# Patient Record
Sex: Female | Born: 1970 | Race: White | Hispanic: No | Smoking: Never smoker
Health system: Southern US, Community
[De-identification: ages and names within clinical notes are randomized; demographics above are authoritative.]

## PROBLEM LIST (undated history)

## (undated) DIAGNOSIS — F329 Major depressive disorder, single episode, unspecified: Secondary | ICD-10-CM

## (undated) DIAGNOSIS — F32A Depression, unspecified: Secondary | ICD-10-CM

## (undated) HISTORY — PX: ABDOMINAL HYSTERECTOMY: SHX81

---

## 2006-06-20 ENCOUNTER — Emergency Department: Payer: Self-pay | Admitting: Emergency Medicine

## 2007-06-24 ENCOUNTER — Ambulatory Visit: Payer: Self-pay | Admitting: Unknown Physician Specialty

## 2007-06-30 ENCOUNTER — Ambulatory Visit: Payer: Self-pay | Admitting: Unknown Physician Specialty

## 2007-08-19 ENCOUNTER — Ambulatory Visit: Payer: Self-pay | Admitting: Obstetrics and Gynecology

## 2007-08-30 ENCOUNTER — Inpatient Hospital Stay: Payer: Self-pay | Admitting: Obstetrics and Gynecology

## 2010-01-31 ENCOUNTER — Ambulatory Visit: Payer: Self-pay

## 2010-02-12 ENCOUNTER — Ambulatory Visit: Payer: Self-pay

## 2010-02-26 ENCOUNTER — Ambulatory Visit: Payer: Self-pay | Admitting: Surgery

## 2012-05-24 ENCOUNTER — Ambulatory Visit: Payer: Self-pay

## 2014-12-12 ENCOUNTER — Encounter: Payer: Self-pay | Admitting: Emergency Medicine

## 2014-12-12 ENCOUNTER — Emergency Department
Admission: EM | Admit: 2014-12-12 | Discharge: 2014-12-12 | Disposition: A | Discharge status: Home / Self Care | Payer: BC Managed Care – PPO | Attending: Emergency Medicine | Admitting: Emergency Medicine

## 2014-12-12 DIAGNOSIS — Z760 Encounter for issue of repeat prescription: Secondary | ICD-10-CM | POA: Diagnosis present

## 2014-12-12 DIAGNOSIS — Z79899 Other long term (current) drug therapy: Secondary | ICD-10-CM | POA: Diagnosis not present

## 2014-12-12 HISTORY — DX: Depression, unspecified: F32.A

## 2014-12-12 HISTORY — DX: Major depressive disorder, single episode, unspecified: F32.9

## 2014-12-12 MED ORDER — ESTROGENS CONJUGATED 0.45 MG PO TABS: 0.4500 mg | ORAL_TABLET | Freq: Every day | ORAL | Status: DC

## 2014-12-12 MED ORDER — DULOXETINE HCL 20 MG PO CPEP: 20.0000 mg | ORAL_CAPSULE | Freq: Two times a day (BID) | ORAL | Status: DC

## 2014-12-12 NOTE — Discharge Instructions (Signed)
Medication Refill, Emergency Department °We have refilled your medication today as a courtesy to you. It is best for your medical care, however, to take care of getting refills done through your primary caregiver's office. They have your records and can do a better job of follow-up than we can in the emergency department. °On maintenance medications, we often only prescribe enough medications to get you by until you are able to see your regular caregiver. This is a more expensive way to refill medications. °In the future, please plan for refills so that you will not have to use the emergency department for this. °Thank you for your help. Your help allows us to better take care of the daily emergencies that enter our department. °Document Released: 06/27/2003 Document Revised: 06/02/2011 Document Reviewed: 06/17/2013 °ExitCare® Patient Information ©2015 [employer]. This information is not intended to replace advice given to you by your health care provider. Make sure you discuss any questions you have with your health care provider. ° °

## 2014-12-12 NOTE — ED Provider Notes (Signed)
[facility] Emergency Department Provider Note  ____________________________________________  Time seen: On arrival  I have reviewed the triage vital signs and the nursing notes.   HISTORY  Chief Complaint Medication Refill    HPI Alexandra Parks is a 44 y.o. female who presents to the emergency department for medication refill. She reports she is running low on her Cymbalta and Premarin. The CT prescriber her physician at [facility] side but they only do obstetrics now. She is searching for a new provider. She has no symptomsor physical complaints. Denies suicidal ideation but does admit to several stressors at this time.    Past Medical History  Diagnosis Date  . Depression     There are no active problems to display for this patient.   Past Surgical History  Procedure Laterality Date  . Abdominal hysterectomy    . Cesarean section      Current Outpatient Rx  Name  Route  Sig  Dispense  Refill  . DULoxetine (CYMBALTA) 20 MG capsule   Oral   Take 20 mg by mouth 2 (two) times daily.         Marland Kitchen estrogens, conjugated, (PREMARIN) 0.45 MG tablet   Oral   Take 0.45 mg by mouth daily. Take daily for 21 days then do not take for 7 days.           Allergies Sulfa antibiotics  No family history on file.  Social History Social History  Substance Use Topics  . Smoking status: Never Smoker   . Smokeless tobacco: None  . Alcohol Use: No    Review of Systems  Constitutional: Negative for fever. Eyes: Negative for visual changes. ENT: Negative for sore throat Genitourinary: Negative for dysuria. Musculoskeletal: Negative for back pain. Skin: Negative for rash. Neurological: Negative for headaches    ____________________________________________   PHYSICAL EXAM:  VITAL SIGNS: ED Triage Vitals  Enc Vitals Group     BP 12/12/14 1053 157/85 mmHg     Pulse Rate 12/12/14 1052 111     Resp 12/12/14 1052 16     Temp 12/12/14 1052 98.1 F  (36.7 C)     Temp Source 12/12/14 1052 Oral     SpO2 12/12/14 1052 100 %     Weight 12/12/14 1052 185 lb (83.915 kg)     Height 12/12/14 1052  (1.626 m)     Head Cir --      Peak Flow --      Pain Score 12/12/14 1053 0     Pain Loc --      Pain Edu? --      Excl. in GC? --      Constitutional: Alert and oriented. Well appearing and in no distress. Eyes: Conjunctivae are normal.  ENT   Head: Normocephalic and atraumatic.   Mouth/Throat: Mucous membranes are moist. Respiratory: Normal respiratory effort without tachypnea nor retractions.  Gastrointestinal: Soft and non-tender in all quadrants. No distention. There is no CVA tenderness. Musculoskeletal: Nontender with normal range of motion in all extremities. Neurologic:  Normal speech and language. No gross focal neurologic deficits are appreciated. Skin:  Skin is warm, dry and intact. No rash noted. Psychiatric: Mood and affect are normal. Patient exhibits appropriate insight and judgment.  ____________________________________________    LABS (pertinent positives/negatives)  Labs Reviewed - No data to display  ____________________________________________     ____________________________________________    RADIOLOGY I have personally reviewed any xrays that were ordered on this patient: None ____________________________________________  PROCEDURES  Procedure(s) performed: none   ____________________________________________   INITIAL IMPRESSION / ASSESSMENT AND PLAN / ED COURSE  Pertinent labs & imaging results that were available during my care of the patient were reviewed by me and considered in my medical decision making (see chart for details).  Patient well-appearing no acute distress. She feels well. We will refill her Cymbalta  that she has been on them for some time and has tolerated them well and she knows that she needs to find a primary care provider. Patient has no interest in seeing  a psychiatrist today to discuss the stress that is going on her life  ____________________________________________   FINAL CLINICAL IMPRESSION(S) / ED DIAGNOSES  Final diagnoses:  Encounter for medication refill     Jene Every, MD 12/12/14 1132

## 2014-12-12 NOTE — ED Notes (Signed)
Pt reports needing a medication refill for cymbalta. Reports PCP isn't seeing patients now.

## 2014-12-12 NOTE — ED Notes (Signed)
States she is running out of her meds   Was seen in [facility] at [facility] for general medical issues .denies any sx's

## 2016-10-24 ENCOUNTER — Emergency Department
Admission: EM | Admit: 2016-10-24 | Discharge: 2016-10-24 | Disposition: A | Discharge status: Home / Self Care | Payer: No Typology Code available for payment source | Attending: Emergency Medicine | Admitting: Emergency Medicine

## 2016-10-24 ENCOUNTER — Emergency Department: Payer: No Typology Code available for payment source

## 2016-10-24 ENCOUNTER — Encounter: Payer: Self-pay | Admitting: Emergency Medicine

## 2016-10-24 DIAGNOSIS — Y92410 Unspecified street and highway as the place of occurrence of the external cause: Secondary | ICD-10-CM | POA: Diagnosis not present

## 2016-10-24 DIAGNOSIS — M791 Myalgia: Secondary | ICD-10-CM | POA: Insufficient documentation

## 2016-10-24 DIAGNOSIS — R1011 Right upper quadrant pain: Secondary | ICD-10-CM

## 2016-10-24 DIAGNOSIS — S301XXA Contusion of abdominal wall, initial encounter: Secondary | ICD-10-CM | POA: Insufficient documentation

## 2016-10-24 DIAGNOSIS — Y9389 Activity, other specified: Secondary | ICD-10-CM | POA: Insufficient documentation

## 2016-10-24 DIAGNOSIS — N390 Urinary tract infection, site not specified: Secondary | ICD-10-CM | POA: Insufficient documentation

## 2016-10-24 DIAGNOSIS — Y999 Unspecified external cause status: Secondary | ICD-10-CM | POA: Diagnosis not present

## 2016-10-24 DIAGNOSIS — M7918 Myalgia, other site: Secondary | ICD-10-CM

## 2016-10-24 DIAGNOSIS — S3991XA Unspecified injury of abdomen, initial encounter: Secondary | ICD-10-CM | POA: Diagnosis present

## 2016-10-24 DIAGNOSIS — R1032 Left lower quadrant pain: Secondary | ICD-10-CM

## 2016-10-24 DIAGNOSIS — R52 Pain, unspecified: Secondary | ICD-10-CM

## 2016-10-24 LAB — URINALYSIS, COMPLETE (UACMP) WITH MICROSCOPIC
Bilirubin Urine: NEGATIVE
GLUCOSE, UA: NEGATIVE mg/dL
Hgb urine dipstick: NEGATIVE
Ketones, ur: NEGATIVE mg/dL
NITRITE: POSITIVE — AB
PROTEIN: NEGATIVE mg/dL
Specific Gravity, Urine: 1.017 (ref 1.005–1.030)
pH: 5 (ref 5.0–8.0)

## 2016-10-24 MED ORDER — CYCLOBENZAPRINE HCL 10 MG PO TABS: 10.0000 mg | ORAL_TABLET | Freq: Three times a day (TID) | ORAL | 0 refills | Status: DC | PRN

## 2016-10-24 MED ORDER — KETOROLAC TROMETHAMINE 30 MG/ML IJ SOLN
30.0000 mg | Freq: Once | INTRAMUSCULAR | Status: AC
  Administered 2016-10-24: 30 mg via INTRAMUSCULAR
  Filled 2016-10-24: qty 1

## 2016-10-24 MED ORDER — IBUPROFEN 600 MG PO TABS: 600.0000 mg | ORAL_TABLET | Freq: Three times a day (TID) | ORAL | 0 refills | Status: DC | PRN

## 2016-10-24 MED ORDER — ORPHENADRINE CITRATE 30 MG/ML IJ SOLN
60.0000 mg | Freq: Once | INTRAMUSCULAR | Status: AC
  Administered 2016-10-24: 60 mg via INTRAMUSCULAR
  Filled 2016-10-24: qty 2

## 2016-10-24 MED ORDER — CIPROFLOXACIN HCL 500 MG PO TABS: 500.0000 mg | ORAL_TABLET | Freq: Two times a day (BID) | ORAL | 0 refills | Status: DC

## 2016-10-24 MED ORDER — OXYCODONE-ACETAMINOPHEN 5-325 MG PO TABS: 1.0000 | ORAL_TABLET | Freq: Four times a day (QID) | ORAL | 0 refills | Status: DC | PRN

## 2016-10-24 MED ORDER — HYDROMORPHONE HCL 1 MG/ML IJ SOLN
1.0000 mg | Freq: Once | INTRAMUSCULAR | Status: AC
Start: 2016-10-24 — End: 2016-10-24
  Administered 2016-10-24: 1 mg via INTRAMUSCULAR
  Filled 2016-10-24: qty 1

## 2016-10-24 NOTE — ED Notes (Signed)
Patient c/o numbness. PA notified.
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
## 2016-10-24 NOTE — ED Notes (Signed)
Pt verbalized understanding of discharge instructions. NAD at this time. 
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
## 2016-11-02 ENCOUNTER — Emergency Department
Admission: EM | Admit: 2016-11-02 | Discharge: 2016-11-02 | Disposition: A | Discharge status: Home / Self Care | Payer: No Typology Code available for payment source | Attending: Emergency Medicine | Admitting: Emergency Medicine

## 2016-11-02 ENCOUNTER — Emergency Department: Payer: No Typology Code available for payment source

## 2016-11-02 ENCOUNTER — Encounter: Payer: Self-pay | Admitting: Emergency Medicine

## 2016-11-02 DIAGNOSIS — S86819A Strain of other muscle(s) and tendon(s) at lower leg level, unspecified leg, initial encounter: Secondary | ICD-10-CM | POA: Insufficient documentation

## 2016-11-02 DIAGNOSIS — Z79899 Other long term (current) drug therapy: Secondary | ICD-10-CM | POA: Insufficient documentation

## 2016-11-02 DIAGNOSIS — R0789 Other chest pain: Secondary | ICD-10-CM | POA: Diagnosis not present

## 2016-11-02 DIAGNOSIS — Y939 Activity, unspecified: Secondary | ICD-10-CM | POA: Diagnosis not present

## 2016-11-02 DIAGNOSIS — Y999 Unspecified external cause status: Secondary | ICD-10-CM | POA: Insufficient documentation

## 2016-11-02 DIAGNOSIS — Y929 Unspecified place or not applicable: Secondary | ICD-10-CM | POA: Insufficient documentation

## 2016-11-02 DIAGNOSIS — S86912A Strain of unspecified muscle(s) and tendon(s) at lower leg level, left leg, initial encounter: Secondary | ICD-10-CM

## 2016-11-02 DIAGNOSIS — S8992XA Unspecified injury of left lower leg, initial encounter: Secondary | ICD-10-CM | POA: Diagnosis present

## 2016-11-02 DIAGNOSIS — R0781 Pleurodynia: Secondary | ICD-10-CM

## 2016-11-02 MED ORDER — MELOXICAM 15 MG PO TABS: 15.0000 mg | ORAL_TABLET | Freq: Every day | ORAL | 0 refills | Status: DC

## 2016-11-02 MED ORDER — CYCLOBENZAPRINE HCL 10 MG PO TABS: 10.0000 mg | ORAL_TABLET | Freq: Three times a day (TID) | ORAL | 0 refills | Status: DC | PRN
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
## 2016-11-02 NOTE — Discharge Instructions (Signed)
Please follow up with orthopedics.  Stop the ibuprofen and take the meloxicam instead. Wear the knee brace when you are out of bed. Return to the emergency department for symptoms of that change or worsen if her unable schedule an appointment.
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
## 2016-11-02 NOTE — ED Notes (Signed)
See triage note. conts to have pain  States pain is mainly in left knee  Min swelling

## 2016-11-05 ENCOUNTER — Encounter: Payer: Self-pay | Admitting: *Deleted

## 2016-11-05 ENCOUNTER — Emergency Department: Payer: No Typology Code available for payment source

## 2016-11-05 ENCOUNTER — Emergency Department
Admission: EM | Admit: 2016-11-05 | Discharge: 2016-11-05 | Disposition: A | Discharge status: Home / Self Care | Payer: No Typology Code available for payment source | Attending: Emergency Medicine | Admitting: Emergency Medicine

## 2016-11-05 DIAGNOSIS — R1084 Generalized abdominal pain: Secondary | ICD-10-CM | POA: Diagnosis present

## 2016-11-05 DIAGNOSIS — Z79899 Other long term (current) drug therapy: Secondary | ICD-10-CM | POA: Diagnosis not present

## 2016-11-05 DIAGNOSIS — Y92410 Unspecified street and highway as the place of occurrence of the external cause: Secondary | ICD-10-CM | POA: Diagnosis not present

## 2016-11-05 DIAGNOSIS — Y939 Activity, unspecified: Secondary | ICD-10-CM | POA: Insufficient documentation

## 2016-11-05 DIAGNOSIS — Y999 Unspecified external cause status: Secondary | ICD-10-CM | POA: Insufficient documentation

## 2016-11-05 DIAGNOSIS — S301XXA Contusion of abdominal wall, initial encounter: Secondary | ICD-10-CM | POA: Diagnosis not present

## 2016-11-05 LAB — URINALYSIS, COMPLETE (UACMP) WITH MICROSCOPIC
Bacteria, UA: NONE SEEN
Bilirubin Urine: NEGATIVE
GLUCOSE, UA: NEGATIVE mg/dL
HGB URINE DIPSTICK: NEGATIVE
KETONES UR: NEGATIVE mg/dL
LEUKOCYTES UA: NEGATIVE
NITRITE: NEGATIVE
Protein, ur: NEGATIVE mg/dL
Specific Gravity, Urine: 1.015 (ref 1.005–1.030)
pH: 5 (ref 5.0–8.0)

## 2016-11-05 LAB — COMPREHENSIVE METABOLIC PANEL
ALBUMIN: 4.2 g/dL (ref 3.5–5.0)
ALK PHOS: 96 U/L (ref 38–126)
ALT: 36 U/L (ref 14–54)
ANION GAP: 9 (ref 5–15)
AST: 31 U/L (ref 15–41)
BILIRUBIN TOTAL: 0.4 mg/dL (ref 0.3–1.2)
BUN: 14 mg/dL (ref 6–20)
CALCIUM: 10.4 mg/dL — AB (ref 8.9–10.3)
CO2: 26 mmol/L (ref 22–32)
Chloride: 104 mmol/L (ref 101–111)
Creatinine, Ser: 1.02 mg/dL — ABNORMAL HIGH (ref 0.44–1.00)
GLUCOSE: 101 mg/dL — AB (ref 65–99)
POTASSIUM: 3.9 mmol/L (ref 3.5–5.1)
Sodium: 139 mmol/L (ref 135–145)
TOTAL PROTEIN: 7.4 g/dL (ref 6.5–8.1)

## 2016-11-05 LAB — LIPASE, BLOOD: Lipase: 27 U/L (ref 11–51)

## 2016-11-05 LAB — CBC
HEMATOCRIT: 40.1 % (ref 35.0–47.0)
HEMOGLOBIN: 13.6 g/dL (ref 12.0–16.0)
MCH: 30.5 pg (ref 26.0–34.0)
MCHC: 33.9 g/dL (ref 32.0–36.0)
MCV: 89.9 fL (ref 80.0–100.0)
Platelets: 273 10*3/uL (ref 150–440)
RBC: 4.46 MIL/uL (ref 3.80–5.20)
RDW: 13.4 % (ref 11.5–14.5)
WBC: 7.3 10*3/uL (ref 3.6–11.0)

## 2016-11-05 MED ORDER — IOPAMIDOL (ISOVUE-300) INJECTION 61%
30.0000 mL | Freq: Once | INTRAVENOUS | Status: AC | PRN
  Administered 2016-11-05: 30 mL via ORAL

## 2016-11-05 MED ORDER — IOPAMIDOL (ISOVUE-300) INJECTION 61%
100.0000 mL | Freq: Once | INTRAVENOUS | Status: AC | PRN
  Administered 2016-11-05: 100 mL via INTRAVENOUS

## 2016-11-05 NOTE — ED Notes (Signed)

## 2016-11-05 NOTE — ED Notes (Signed)
Iv started   Pt drinking po contrast 
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
## 2016-11-05 NOTE — ED Notes (Signed)
Pt assisted to ambulate from the stretcher to the commode and back to the stretcher; pt displays a steady, even and balanced gait.

## 2016-11-05 NOTE — ED Notes (Signed)
Report off to kenny rn  
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
## 2016-11-05 NOTE — ED Notes (Signed)
Pt waiting for er md to see pt.  Pt texting on cell phone.

## 2017-05-27 ENCOUNTER — Other Ambulatory Visit: Payer: Self-pay | Admitting: Student

## 2017-05-27 DIAGNOSIS — Z1239 Encounter for other screening for malignant neoplasm of breast: Secondary | ICD-10-CM

## 2017-06-02 ENCOUNTER — Ambulatory Visit: Payer: Medicaid Other | Admitting: Pharmacy Technician

## 2017-06-02 ENCOUNTER — Encounter (INDEPENDENT_AMBULATORY_CARE_PROVIDER_SITE_OTHER): Payer: Self-pay

## 2017-06-02 DIAGNOSIS — Z79899 Other long term (current) drug therapy: Secondary | ICD-10-CM

## 2017-06-02 NOTE — Progress Notes (Signed)
Completed [facility] application and contract.  Patient agreed to all terms of the [facility] contract.    Patient lives in [place].  Patient is seeing a provider in [place].  Explained to patient that she meets [facility] eligibility criteria because she is seeing an [facility] provider.  Patient acknowledged that she understood that if she does continue to see an [facility] provider that [facility] would not be able to continue to provide medication assistance since she is not an [facility] resident.  Patient approved to receive medication assistance at [facility] through 2019, as long as eligibility criteria continues to be met.    Provided patient with community resource material based on her particular needs.    Cymbalta & Premarin Prescription Applications completed with patient.  Forwarded to [employer] for signature.  Upon receipt of signed applications from provider, Cymbalta Prescription Application will be submitted to [employer] & Premarin Prescription Application will be submitted to [facility].  [facility]

## 2017-06-09 ENCOUNTER — Telehealth: Payer: Self-pay | Admitting: Pharmacist

## 2017-06-09 NOTE — Telephone Encounter (Signed)
06/09/2017 4:07:45 PM - Cymbalta  06/09/17 Faxed [employer] application for Cymbalta 30mg  Take 1 capsule by mouth two times a day.AJ   06/09/2017 4:07:07 PM - Premarin  06/09/17 Faxed [employer] application for Premarin 0.3mg  Take 1 by mouth daily.Forde RadonAJ

## 2017-07-08 ENCOUNTER — Encounter: Payer: Self-pay | Admitting: Pharmacist

## 2017-07-08 ENCOUNTER — Other Ambulatory Visit: Payer: Self-pay

## 2017-07-08 ENCOUNTER — Encounter (INDEPENDENT_AMBULATORY_CARE_PROVIDER_SITE_OTHER): Payer: Self-pay

## 2017-07-08 ENCOUNTER — Ambulatory Visit: Payer: Medicaid Other | Admitting: Pharmacist

## 2017-07-08 VITALS — BP 118/86 | Ht 64.0 in | Wt 190.0 lb

## 2017-07-08 DIAGNOSIS — Z79899 Other long term (current) drug therapy: Secondary | ICD-10-CM

## 2017-07-08 NOTE — Progress Notes (Addendum)
  [facility] Visit Note  Patient: Alexandra Parks MRN: [id number] Date of Birth: 05/24/1970 PCP: [facility]   [address] 47 y.o. female presents for an initial medication therapy managmet visit today, and presents with her pill bottles for review.  BP 118/86 (BP Location: Right Arm, Patient Position: Sitting, Cuff Size: Large)   Ht 5\' 4"  (1.626 m)   Wt 190 lb (86.2 kg)   BMI 32.61 kg/m   Patient Information   Past Medical History:  Diagnosis Date  . Depression       Past Surgical History:  Procedure Laterality Date  . ABDOMINAL HYSTERECTOMY    . CESAREAN SECTION      History reviewed. No pertinent family history.  New Diagnoses (since last visit): NA  Family Support: Good        Social History   Substance and Sexual Activity  Alcohol Use No    Social History   Tobacco Use  Smoking Status Never Smoker  Smokeless Tobacco Never Used     Health Maintenance  Topic Date Due  . HIV Screening  07/19/1986  . TETANUS/TDAP  07/19/1990  . PAP SMEAR  07/18/1992  . INFLUENZA VACCINE  10/22/2017    Assessment and Plan:  Medication Compliance: Patient was off all medications for ~8-9 months due to losing her insurance coverage. She has since restarted 3 medications, of which she is very aware of their indications, dosing and frequency. She correctly states her dose titration for duloxetine and explained her reasoning for asking her PCP to restart her medications in a step wise fashion.   Anxiety/Depression: Patient is currently in the process of restarting citalopram and duloxetine. She has started taking citalopram ~1 month ago, and is currently taking duloxetine 30 mg daily x7 days, and will then increase to 60 mg daily. She states that this regimen has worked for her in the past and she can tell that they have already started to help since restarting.  HRT: Patient currently taking estridiol while waiting to receive her Premarin  through PAP. States that the estradiol is working well to control her hot flashes, which are her worst symptom.   RTC: 1 year  Cori RazorLauren Royce Stegman, PharmD Candidate   Cosigned:Christan Leonor LivHolt, PharmD, RPh [facility] [phone]

## 2018-02-17 ENCOUNTER — Telehealth: Payer: Self-pay | Admitting: Pharmacy Technician

## 2018-02-17 NOTE — Telephone Encounter (Signed)
Patient has Medicaid.  No longer meets [facility]'s eligibility criteria.  Pt notified.  Gwenlyn Hottinger J. Bacilio Abascal Care Manager [facility] 
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
